# Patient Record
Sex: Male | Born: 1945 | Race: White | Hispanic: No | Marital: Married | Smoking: Never smoker
Health system: Southern US, Community
[De-identification: ages and names within clinical notes are randomized; demographics above are authoritative.]

## PROBLEM LIST (undated history)

## (undated) DIAGNOSIS — G20A1 Parkinson's disease without dyskinesia, without mention of fluctuations: Secondary | ICD-10-CM

---

## 2019-07-10 ENCOUNTER — Ambulatory Visit
Admission: RE | Admit: 2019-07-10 | Discharge: 2019-07-10 | Disposition: A | Discharge status: Home / Self Care | Payer: Medicare PPO | Attending: Family Medicine | Admitting: Family Medicine

## 2019-07-10 ENCOUNTER — Other Ambulatory Visit: Payer: Self-pay

## 2019-07-10 ENCOUNTER — Ambulatory Visit
Admission: RE | Admit: 2019-07-10 | Discharge: 2019-07-10 | Disposition: A | Discharge status: Home / Self Care | Payer: Medicare PPO | Source: Ambulatory Visit | Attending: Family Medicine | Admitting: Family Medicine

## 2019-07-10 ENCOUNTER — Other Ambulatory Visit: Payer: Self-pay | Admitting: Family Medicine

## 2019-07-10 DIAGNOSIS — M546 Pain in thoracic spine: Secondary | ICD-10-CM | POA: Diagnosis present

## 2019-07-10 DIAGNOSIS — W19XXXA Unspecified fall, initial encounter: Secondary | ICD-10-CM | POA: Diagnosis not present

## 2019-07-10 DIAGNOSIS — Y92009 Unspecified place in unspecified non-institutional (private) residence as the place of occurrence of the external cause: Secondary | ICD-10-CM | POA: Diagnosis not present

## 2020-06-30 IMAGING — CR DG LUMBAR SPINE 2-3V
4 series · 4 of 4 positions shown · non-contrast
Comparison: No prior.

CLINICAL DATA: Fall.  Back pain.

EXAM:
LUMBAR SPINE - 2-3 VIEW

[l-spine ap]
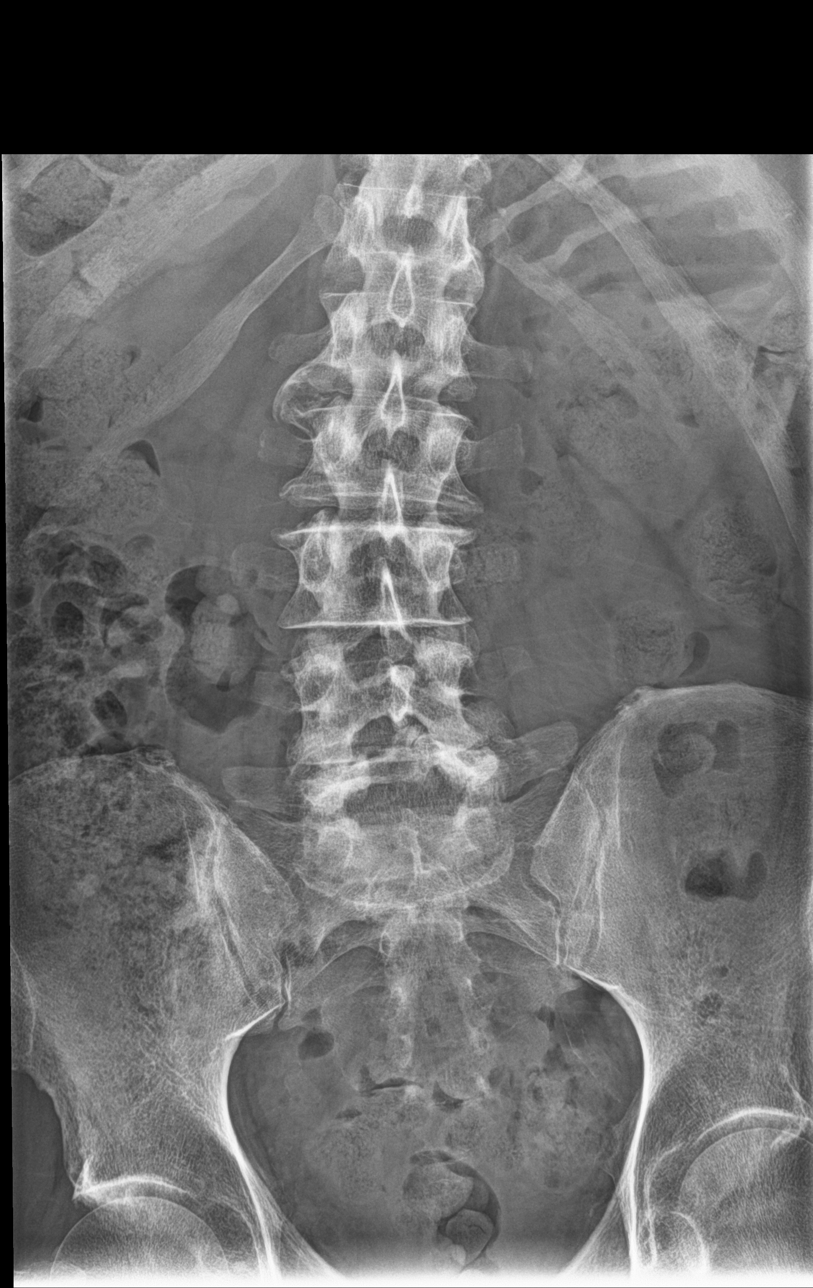

[l-spine lat]
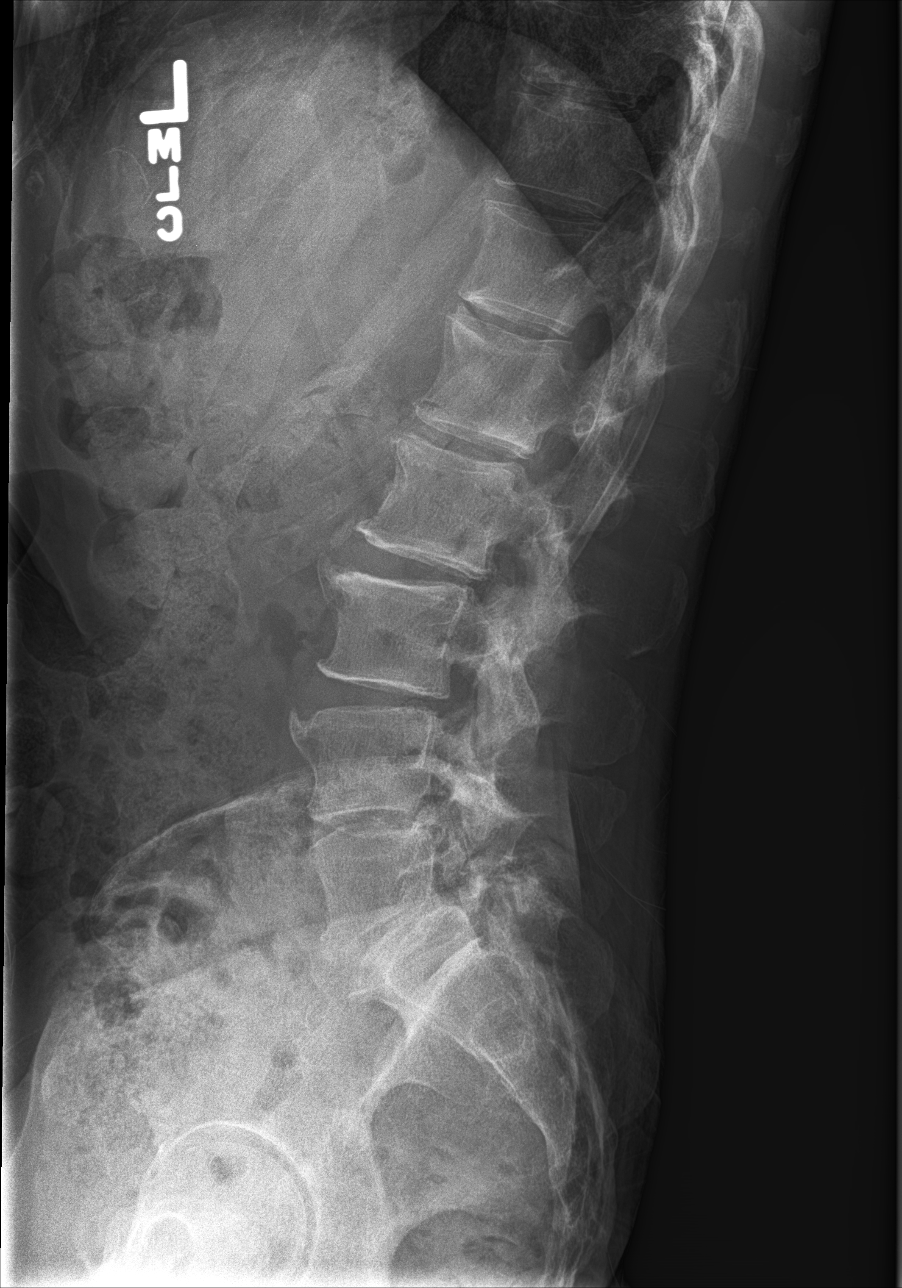

[l-spine spot (1 of 2)]
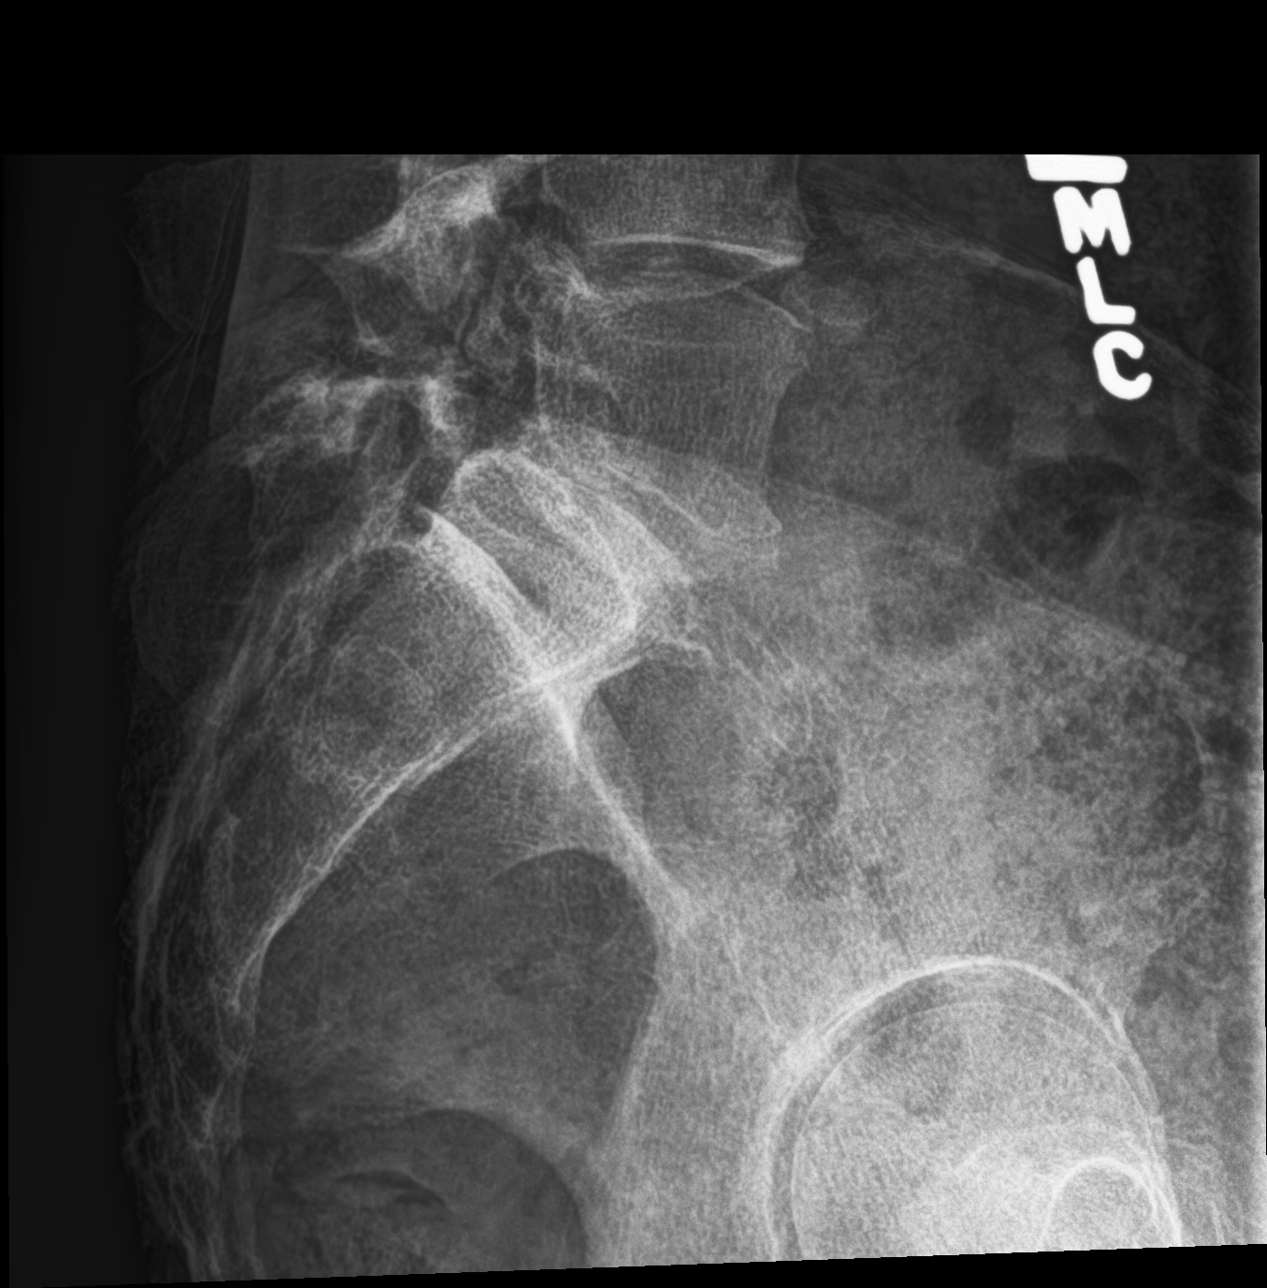

[l-spine spot (2 of 2)]
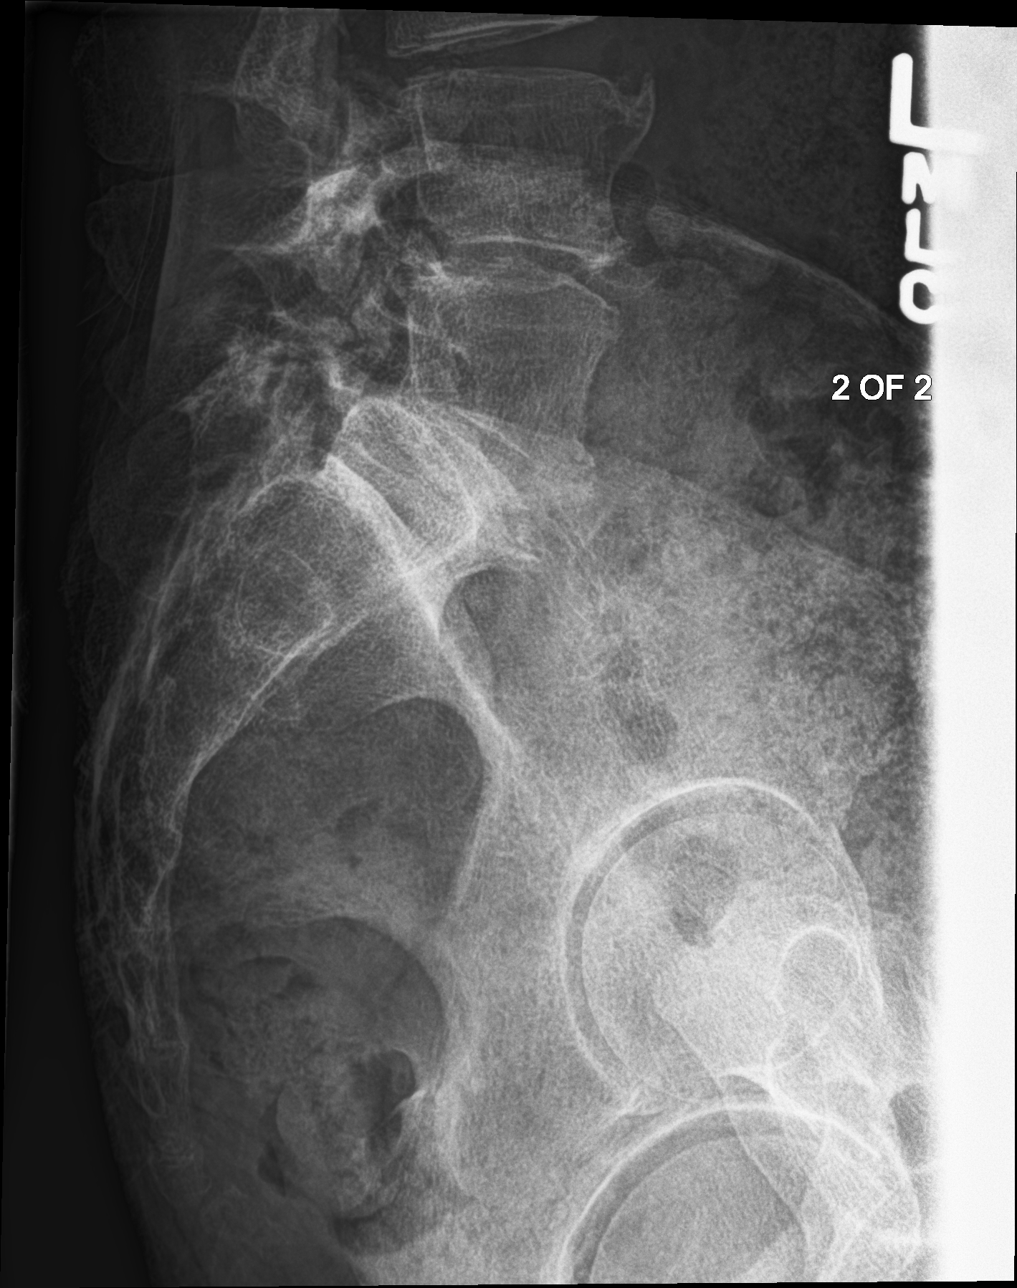

[4 of 4 positions shown; findings below may reference images not displayed]

FINDINGS: Lumbar spine scoliosis concave left. Diffuse multilevel degenerative
change. No acute bony abnormality identified. No evidence of
fracture.
IMPRESSION: 1. Lumbar spine scoliosis concave left. Diffuse multilevel
degenerative change. No acute abnormality identified.

2. Stool noted throughout the colon. Constipation could present this
fashion.

## 2022-06-30 ENCOUNTER — Ambulatory Visit (INDEPENDENT_AMBULATORY_CARE_PROVIDER_SITE_OTHER): Payer: Medicare PPO

## 2022-06-30 ENCOUNTER — Ambulatory Visit
Admission: EM | Admit: 2022-06-30 | Discharge: 2022-06-30 | Disposition: A | Discharge status: Home / Self Care | Payer: Medicare PPO

## 2022-06-30 ENCOUNTER — Other Ambulatory Visit: Payer: Self-pay

## 2022-06-30 ENCOUNTER — Ambulatory Visit: Payer: Self-pay

## 2022-06-30 DIAGNOSIS — R251 Tremor, unspecified: Secondary | ICD-10-CM

## 2022-06-30 DIAGNOSIS — W19XXXA Unspecified fall, initial encounter: Secondary | ICD-10-CM | POA: Diagnosis not present

## 2022-06-30 DIAGNOSIS — R270 Ataxia, unspecified: Secondary | ICD-10-CM

## 2022-06-30 DIAGNOSIS — S12201A Unspecified nondisplaced fracture of third cervical vertebra, initial encounter for closed fracture: Secondary | ICD-10-CM | POA: Diagnosis not present

## 2022-06-30 DIAGNOSIS — S12301A Unspecified nondisplaced fracture of fourth cervical vertebra, initial encounter for closed fracture: Secondary | ICD-10-CM

## 2022-06-30 HISTORY — DX: Parkinson's disease without dyskinesia, without mention of fluctuations: G20.A1

## 2022-06-30 NOTE — ED Notes (Addendum)
Patient is being discharged from the Urgent Care and sent to the Emergency Department, patient declined and signed AMA . Per Margarette Canada, NP patient is in need of higher level of care due to fractures in vertebral bodies. Patient is aware and verbalizes understanding of plan of care.  Vitals:   06/30/22 1629  BP: (!) 143/89  Pulse: 80  Resp: 16  Temp: 98.1 F (36.7 C)  SpO2: 100%

## 2022-06-30 NOTE — Discharge Instructions (Addendum)
As we discussed, you have fractures of the vertebral bodies of your third and fourth vertebrae in your neck.  We have recommended that she wear a hard collar until evaluated by a spine specialist and you have declined.  I recommend that you contact your neurology team in the morning to ask their opinion.  If you have any worsening pain in your neck or if you develop any numbness, tingling, or weakness in any of your extremities I recommend you call 911 and go to the emergency department.

## 2022-06-30 NOTE — ED Provider Notes (Signed)
[facility]    CSN: [id number] Arrival date & time: 06/30/22  1604      History   Chief Complaint Chief Complaint  Patient presents with   Neck Injury    HPI Carl Durham is a 76 y.o. male.   HPI  76 year old male here for evaluation of neck pain.  Patient reports that he fell backwards onto hardwood floor 2 days ago.  He tried to brace himself with his elbows but he states that he hit his neck on the floor.  He is complaining of pain in the right side of his neck but denies any numbness or tingling.  He does not think he struck his head and did not have a loss of consciousness.  He denies any numbness or tingling in his extremities, weakness, or loss of bowel or bladder control.  Past Medical History:  Diagnosis Date   Parkinson's disease     There are no problems to display for this patient.   History reviewed. No pertinent surgical history.     Home Medications    Prior to Admission medications   Medication Sig Start Date End Date Taking? Authorizing Provider  atorvastatin (LIPITOR) 40 MG tablet Take 40 mg by mouth daily.   Yes [provider]  Carbidopa-Levodopa ER (SINEMET CR) 25-100 MG tablet controlled release Take by mouth. 06/09/20  Yes [provider]  carboxymethylcellulose (REFRESH PLUS) 0.5 % SOLN 1 drop 3 (three) times daily as needed.   Yes [provider]  finasteride (PROSCAR) 5 MG tablet Take by mouth. 02/19/20  Yes [provider]  omeprazole (PRILOSEC) 20 MG capsule Take by mouth. 10/17/19  Yes [provider]  sodium chloride 1 g tablet Take by mouth. 05/06/22  Yes [provider]  tamsulosin (FLOMAX) 0.4 MG CAPS capsule Take by mouth. 10/17/19 06/07/23 Yes [provider]    Family History History reviewed. No pertinent family history.  Social History Social History   Tobacco Use   Smoking status: Never   Smokeless tobacco: Never  Vaping Use   Vaping Use: Never used   Substance Use Topics   Alcohol use: Not Currently   Drug use: Not Currently     Allergies   Citric acid, Doxycycline, Silicone, and Sulfamethoxazole-trimethoprim   Review of Systems Review of Systems  Musculoskeletal:  Positive for back pain and neck pain.  Skin:  Negative for wound.  Neurological:  Negative for syncope, weakness, numbness and headaches.  Hematological: Negative.   Psychiatric/Behavioral: Negative.       Physical Exam Triage Vital Signs ED Triage Vitals  Enc Vitals Group     BP 06/30/22 1629 (!) 143/89     Pulse Rate 06/30/22 1629 80     Resp 06/30/22 1629 16     Temp 06/30/22 1629 98.1 F (36.7 C)     Temp Source 06/30/22 1629 Oral     SpO2 06/30/22 1629 100 %     Weight 06/30/22 1621 145 lb (65.8 kg)     Height 06/30/22 1621 5\' 9"  (1.753 m)     Head Circumference --      Peak Flow --      Pain Score 06/30/22 1621 3     Pain Loc --      Pain Edu? --      Excl. in [place]? --    No data found.  Updated Vital Signs BP (!) 143/89 (BP Location: Left Arm)   Pulse 80   Temp 98.1 F (  36.7 C) (Oral)   Resp 16   Ht 5\' 9"  (1.753 m)   Wt 145 lb (65.8 kg)   SpO2 100%   BMI 21.41 kg/m   Visual Acuity Right Eye Distance:   Left Eye Distance:   Bilateral Distance:    Right Eye Near:   Left Eye Near:    Bilateral Near:     Physical Exam Vitals and nursing note reviewed.  Constitutional:      Appearance: Normal appearance. He is not ill-appearing.  HENT:     Head: Normocephalic and atraumatic.  Musculoskeletal:        General: Tenderness and signs of injury present. No swelling or deformity. Normal range of motion.  Skin:    General: Skin is warm and dry.     Capillary Refill: Capillary refill takes less than 2 seconds.     Findings: No bruising or erythema.  Neurological:     General: No focal deficit present.     Mental Status: He is alert and oriented to person, place, and time.     Sensory: No sensory deficit.     Motor: No weakness.   Psychiatric:        Mood and Affect: Mood normal.        Behavior: Behavior normal.        Thought Content: Thought content normal.        Judgment: Judgment normal.      UC Treatments / Results  Labs (all labs ordered are listed, but only abnormal results are displayed) Labs Reviewed - No data to display  EKG   Radiology CT Thoracic Spine Wo Contrast  Result Date: 06/30/2022 CLINICAL DATA:  Fall, ataxia EXAM: CT THORACIC SPINE WITHOUT CONTRAST TECHNIQUE: Multidetector CT images of the thoracic were obtained using the standard protocol without intravenous contrast. RADIATION DOSE REDUCTION: This exam was performed according to the departmental dose-optimization program which includes automated exposure control, adjustment of the mA and/or kV according to patient size and/or use of iterative reconstruction technique. COMPARISON:  None Available. FINDINGS: Alignment: Normal thoracic kyphosis. Vertebrae: No acute fracture or focal pathologic process. Paraspinal and other soft tissues: Atherosclerotic calcifications of the aortic root/arch. Coronary atherosclerosis of the LAD. Healing fractures of the right posteromedial 9th through 11th ribs (series 3/images 108, 121, and 138). Disc levels: Moderate multilevel degenerative changes of the mid/lower thoracic spine. Spinal canal is patent. IMPRESSION: No acute traumatic injury to the thoracic spine. Moderate degenerative changes. Healing fractures of the right posteromedial 9th through 11th ribs. Aortic Atherosclerosis (ICD10-I70.0). Electronically Signed   By: Julian Hy M.D.   On: 06/30/2022 17:39   CT Cervical Spine Wo Contrast  Result Date: 06/30/2022 CLINICAL DATA:  Fall, ataxia, tremor EXAM: CT HEAD WITHOUT CONTRAST CT CERVICAL SPINE WITHOUT CONTRAST TECHNIQUE: Multidetector CT imaging of the head and cervical spine was performed following the standard protocol without intravenous contrast. Multiplanar CT image reconstructions of  the cervical spine were also generated. RADIATION DOSE REDUCTION: This exam was performed according to the departmental dose-optimization program which includes automated exposure control, adjustment of the mA and/or kV according to patient size and/or use of iterative reconstruction technique. COMPARISON:  None Available. FINDINGS: CT HEAD FINDINGS Mild motion degraded images. Brain: No evidence of acute infarction, hemorrhage, hydrocephalus, extra-axial collection or mass lesion/mass effect. Subcortical white matter and periventricular small vessel ischemic changes. Vascular: Mild intracranial atherosclerosis Skull: No hyperdense vessel or unexpected calcification. Postsurgical changes involving the right frontal bone. Sinuses/Orbits: Opacification of the right  frontal and ethmoid sinuses. Mastoid air cells are clear. Other: None. CT CERVICAL SPINE FINDINGS Alignment: Normal cervical lordosis. Skull base and vertebrae: Nondisplaced fracture involving the anterior/inferior corner of the C4 vertebral body (sagittal image 34). Associated osteophyte fracture involving the anterior/inferior corner of the C3 vertebral body (sagittal image 33), although this appearance may be subacute or chronic. Vertebral body heights are maintained. Soft tissues and spinal canal: No prevertebral fluid or swelling. No visible canal hematoma. Disc levels: Mild degenerative changes, most prominent at C5-6 and C6-7. Spinal canal is patent. Upper chest: Visualized lung apices are clear. Other: Visualized thyroid is unremarkable. IMPRESSION: Nondisplaced fracture involving the anterior/inferior corner of the C4 and possibly C3 vertebral bodies, as above. No acute intracranial abnormality. Small vessel ischemic changes. Electronically Signed   By: Julian Hy M.D.   On: 06/30/2022 17:36   CT Head Wo Contrast  Result Date: 06/30/2022 CLINICAL DATA:  Fall, ataxia, tremor EXAM: CT HEAD WITHOUT CONTRAST CT CERVICAL SPINE WITHOUT  CONTRAST TECHNIQUE: Multidetector CT imaging of the head and cervical spine was performed following the standard protocol without intravenous contrast. Multiplanar CT image reconstructions of the cervical spine were also generated. RADIATION DOSE REDUCTION: This exam was performed according to the departmental dose-optimization program which includes automated exposure control, adjustment of the mA and/or kV according to patient size and/or use of iterative reconstruction technique. COMPARISON:  None Available. FINDINGS: CT HEAD FINDINGS Mild motion degraded images. Brain: No evidence of acute infarction, hemorrhage, hydrocephalus, extra-axial collection or mass lesion/mass effect. Subcortical white matter and periventricular small vessel ischemic changes. Vascular: Mild intracranial atherosclerosis Skull: No hyperdense vessel or unexpected calcification. Postsurgical changes involving the right frontal bone. Sinuses/Orbits: Opacification of the right frontal and ethmoid sinuses. Mastoid air cells are clear. Other: None. CT CERVICAL SPINE FINDINGS Alignment: Normal cervical lordosis. Skull base and vertebrae: Nondisplaced fracture involving the anterior/inferior corner of the C4 vertebral body (sagittal image 34). Associated osteophyte fracture involving the anterior/inferior corner of the C3 vertebral body (sagittal image 33), although this appearance may be subacute or chronic. Vertebral body heights are maintained. Soft tissues and spinal canal: No prevertebral fluid or swelling. No visible canal hematoma. Disc levels: Mild degenerative changes, most prominent at C5-6 and C6-7. Spinal canal is patent. Upper chest: Visualized lung apices are clear. Other: Visualized thyroid is unremarkable. IMPRESSION: Nondisplaced fracture involving the anterior/inferior corner of the C4 and possibly C3 vertebral bodies, as above. No acute intracranial abnormality. Small vessel ischemic changes. Electronically Signed   By:  Julian Hy M.D.   On: 06/30/2022 17:36    Procedures Procedures (including critical care time)  Medications Ordered in UC Medications - No data to display  Initial Impression / Assessment and Plan / UC Course  I have reviewed the triage vital signs and the nursing notes.  Pertinent labs & imaging results that were available during my care of the patient were reviewed by me and considered in my medical decision making (see chart for details).   Patient is a nontoxic-appearing 76 year old male with a past medical history that significant for Parkinson's who presents for evaluation of a ground-level fall that occurred 2 days ago.  He states that he does not think he hit his head and he did not have a loss of consciousness.  He is not complaining of any headache.  He also denies numbness, tingling, weakness of his extremities or loss of bowel or bladder control.  His physical exam reveals some tenderness on the spinous process of  C4 and C7 but no step-off.  He also has spinous process tenderness at T4 and T8, again with no step-off.  The remainder of his cervical, thoracic, and lumbar spine are negative for tenderness or step-off.  There is no appreciable edema, ecchymosis, or erythema overlying the spine.  His bilateral grips are 5/5 in bilateral upper extremity strength is 5/5.  He also has bilateral lower extremity strength that is 5/5.  He has no neurodeficits on exam.  Given his age and history I feel it is prudent to obtain imaging of his cervical and thoracic spine.  Patient is requesting imaging of his head as well.  There is no evidence of trauma however given his age I will obtain CT scan of his head.  Radiology reporting nondisplaced fracture involving the anterior inferior corner of C4 and possibly C3 vertebral bodies.  Radiology impression of thoracic spine CT states no acute traumatic injury of the thoracic spine.  Moderate degenerative changes.  Healing fractures of right posterior  medial ninth through 11th ribs.  Radiology impression of head CT reports no acute intracranial abnormality.  Small vessel ischemic changes.  I have reached out to Dr. Izora Ribas through secure epic chat, who is on-call for [facility] neurosurgery, to discuss management of the spinal fractures.  I have discussed placing the patient in a hard collar and he is declining as he states that he would not be able to tolerate it.  He wants to talk to his neurologist at [facility] tomorrow.   Final Clinical Impressions(s) / UC Diagnoses   Final diagnoses:  Closed nondisplaced fracture of third cervical vertebra, unspecified fracture morphology, initial encounter ([place])  Closed nondisplaced fracture of fourth cervical vertebra, unspecified fracture morphology, initial encounter [facility])     Discharge Instructions      As we discussed, you have fractures of the vertebral bodies of your third and fourth vertebrae in your neck.  We have recommended that she wear a hard collar until evaluated by a spine specialist and you have declined.  I recommend that you contact your neurology team in the morning to ask their opinion.  If you have any worsening pain in your neck or if you develop any numbness, tingling, or weakness in any of your extremities I recommend you call 911 and go to the emergency department.     ED Prescriptions   None    PDMP not reviewed this encounter.   Margarette Canada, NP 06/30/22 [phone]

## 2022-06-30 NOTE — ED Triage Notes (Addendum)
Pt reports he has parkinson's and sometimes feels a pulling sensation from behind and it makes him fall backward. He fell backward and used his elbows to brace himself and states he hit his neck on the wood floor. Pain to right neck. No LOC, no new numbness or tingling.

## 2023-06-15 ENCOUNTER — Telehealth: Payer: Self-pay | Admitting: Family Medicine

## 2023-06-15 NOTE — Telephone Encounter (Signed)
Homebound

## 2023-06-15 NOTE — Telephone Encounter (Signed)
Spoke with family, and told them we will call next week to schedule the home visit.   Client would like pfizer covid-19 and flu.  Zainab Crumrine Sherrilyn Rist, RN

## 2023-06-27 ENCOUNTER — Ambulatory Visit: Payer: Medicare PPO

## 2023-06-27 DIAGNOSIS — Z23 Encounter for immunization: Secondary | ICD-10-CM

## 2023-06-27 DIAGNOSIS — Z719 Counseling, unspecified: Secondary | ICD-10-CM

## 2023-06-27 NOTE — Progress Notes (Signed)
Client seen in his home along with his wife for requested covid and flu vaccines.  Has Parkinson's disease.  Vaccination administration screening questions completed; sent for scanning.  VISs given.  After vaccine care reviewed and questions answered.  Fluzone high dose and Comirnaty 12+ 2024-25 vaccines administered; tolerated well.    [facility] updated and copy mailed to pt.  Cherlynn Polo, RN

## 2024-07-01 ENCOUNTER — Ambulatory Visit (LOCAL_COMMUNITY_HEALTH_CENTER)

## 2024-07-01 DIAGNOSIS — Z23 Encounter for immunization: Secondary | ICD-10-CM

## 2024-07-01 DIAGNOSIS — Z719 Counseling, unspecified: Secondary | ICD-10-CM

## 2024-07-01 NOTE — Progress Notes (Signed)
 Homevisit made to homebound pt (along with his wife) for requested covid and flu vaccines. Vaccine screening questions completed and signed (sent for scanning).  VISs given.  Reviewed post vaccination care and questions answered.  Comirnaty 2025-26 12+ and fluzone high dose administered by Devere Pizza RN; tolerated well.  [facility] updated and copy mailed to pt.  Shona KATHEE Diesel, RN
